# Patient Record
Sex: Male | Born: 1980 | Race: White | Hispanic: No | Marital: Single | Smoking: Current every day smoker
Health system: Southern US, Community
[De-identification: ages and names within clinical notes are randomized; demographics above are authoritative.]

---

## 2006-07-04 ENCOUNTER — Emergency Department: Payer: Self-pay | Admitting: Unknown Physician Specialty

## 2007-05-11 ENCOUNTER — Emergency Department: Payer: Self-pay

## 2008-04-27 ENCOUNTER — Emergency Department: Payer: Self-pay | Admitting: Emergency Medicine

## 2008-07-09 ENCOUNTER — Emergency Department: Payer: Self-pay | Admitting: Emergency Medicine

## 2009-02-18 ENCOUNTER — Emergency Department: Payer: Self-pay | Admitting: Internal Medicine

## 2009-07-15 ENCOUNTER — Emergency Department: Payer: Self-pay | Admitting: Emergency Medicine

## 2011-08-07 ENCOUNTER — Emergency Department: Payer: Self-pay | Admitting: Emergency Medicine

## 2016-05-02 ENCOUNTER — Emergency Department
Admission: EM | Admit: 2016-05-02 | Discharge: 2016-05-02 | Disposition: A | Discharge status: Other Acute Inpatient Hospital | Payer: Self-pay

## 2016-05-02 NOTE — ED Notes (Signed)
Pt ran out of Suboxone. Requesting a 2 day supply not from this area.

## 2016-11-19 DIAGNOSIS — S8991XA Unspecified injury of right lower leg, initial encounter: Secondary | ICD-10-CM | POA: Insufficient documentation

## 2016-11-19 DIAGNOSIS — Z5321 Procedure and treatment not carried out due to patient leaving prior to being seen by health care provider: Secondary | ICD-10-CM | POA: Insufficient documentation

## 2016-11-19 DIAGNOSIS — Y929 Unspecified place or not applicable: Secondary | ICD-10-CM | POA: Insufficient documentation

## 2016-11-19 DIAGNOSIS — F17200 Nicotine dependence, unspecified, uncomplicated: Secondary | ICD-10-CM | POA: Insufficient documentation

## 2016-11-19 DIAGNOSIS — X501XXA Overexertion from prolonged static or awkward postures, initial encounter: Secondary | ICD-10-CM | POA: Insufficient documentation

## 2016-11-19 DIAGNOSIS — Y9367 Activity, basketball: Secondary | ICD-10-CM | POA: Insufficient documentation

## 2016-11-19 DIAGNOSIS — Y999 Unspecified external cause status: Secondary | ICD-10-CM | POA: Insufficient documentation

## 2016-11-20 ENCOUNTER — Emergency Department
Admission: EM | Admit: 2016-11-20 | Discharge: 2016-11-20 | Disposition: A | Discharge status: Home / Self Care | Payer: Self-pay | Attending: Emergency Medicine | Admitting: Emergency Medicine

## 2016-11-20 ENCOUNTER — Emergency Department: Payer: Self-pay

## 2016-11-20 ENCOUNTER — Encounter: Payer: Self-pay | Admitting: Emergency Medicine

## 2016-11-20 DIAGNOSIS — X501XXA Overexertion from prolonged static or awkward postures, initial encounter: Secondary | ICD-10-CM | POA: Insufficient documentation

## 2016-11-20 DIAGNOSIS — Y9367 Activity, basketball: Secondary | ICD-10-CM | POA: Insufficient documentation

## 2016-11-20 DIAGNOSIS — Y929 Unspecified place or not applicable: Secondary | ICD-10-CM | POA: Insufficient documentation

## 2016-11-20 DIAGNOSIS — M25461 Effusion, right knee: Secondary | ICD-10-CM | POA: Insufficient documentation

## 2016-11-20 DIAGNOSIS — F17200 Nicotine dependence, unspecified, uncomplicated: Secondary | ICD-10-CM | POA: Insufficient documentation

## 2016-11-20 DIAGNOSIS — Y999 Unspecified external cause status: Secondary | ICD-10-CM | POA: Insufficient documentation

## 2016-11-20 MED ORDER — TRAMADOL HCL 50 MG PO TABS: 50.0000 mg | ORAL_TABLET | Freq: Four times a day (QID) | ORAL | 0 refills | Status: AC | PRN

## 2016-11-20 MED ORDER — IBUPROFEN 600 MG PO TABS: 600.0000 mg | ORAL_TABLET | Freq: Four times a day (QID) | ORAL | 0 refills | Status: AC | PRN

## 2016-11-20 MED ORDER — TRAMADOL HCL 50 MG PO TABS
50.0000 mg | ORAL_TABLET | Freq: Once | ORAL | Status: AC
  Administered 2016-11-20: 50 mg via ORAL
  Filled 2016-11-20: qty 1

## 2016-11-20 MED ORDER — IBUPROFEN 600 MG PO TABS
600.0000 mg | ORAL_TABLET | Freq: Once | ORAL | Status: AC
  Administered 2016-11-20: 600 mg via ORAL
  Filled 2016-11-20: qty 1

## 2016-11-20 NOTE — Discharge Instructions (Signed)
Knee immobilizer 5-7 days as needed. Do not use splint was sleeping.

## 2016-11-20 NOTE — ED Triage Notes (Signed)
Pt ambulatory to triage in NAD, reports was playing basketball, landed wrong on right leg, saw right knee deform but things snapped back in place.  Pt c/o continued pain.  Pt states currently taking suboxone and has used cocaine tonight.

## 2016-11-20 NOTE — ED Triage Notes (Signed)
Right knee injury yesterday.  Patient presented to ED last night and had X-ray's done, but LWBS.  Returns today.

## 2016-11-20 NOTE — ED Notes (Signed)
Patient already had crutches so he refused the order for crutches.

## 2016-11-20 NOTE — ED Provider Notes (Signed)
[facility] Emergency Department Provider Note   ____________________________________________   None    (approximate)  I have reviewed the triage vital signs and the nursing notes.   HISTORY  Chief Complaint Knee Injury    HPI Clayton Lopez. is a 36 y.o. male patient complaining of right knee pain and edema secondary to a twisting incident playing basketball yesterday. Patient can't ED last night had x-ray performed for left without being seen. Patient returned today with increased pain and edema. Patient stated pain increases with weightbearing.No palliative measures for complaint. Patient rates pain as a 7/10. Describes pain as "achy".  History reviewed. No pertinent past medical history.  There are no active problems to display for this patient.   History reviewed. No pertinent surgical history.  Prior to Admission medications   Medication Sig Start Date End Date Taking? Authorizing Provider  ibuprofen (ADVIL,MOTRIN) 600 MG tablet Take 1 tablet (600 mg total) by mouth every 6 (six) hours as needed. 11/20/16   Joni Reining, PA-C  traMADol (ULTRAM) 50 MG tablet Take 1 tablet (50 mg total) by mouth every 6 (six) hours as needed. 11/20/16 11/20/17  Joni Reining, PA-C    Allergies Patient has no known allergies.  No family history on file.  Social History Social History  Substance Use Topics  . Smoking status: Current Every Day Smoker  . Smokeless tobacco: Never Used  . Alcohol use Yes    Review of Systems  Constitutional: No fever/chills Eyes: No visual changes. ENT: No sore throat. Cardiovascular: Denies chest pain. Respiratory: Denies shortness of breath. Gastrointestinal: No abdominal pain.  No nausea, no vomiting.  No diarrhea.  No constipation. Genitourinary: Negative for dysuria. Musculoskeletal: Negative for back pain. Skin: Negative for rash.  ____________________________________________   PHYSICAL EXAM:  VITAL  SIGNS: ED Triage Vitals [11/20/16 1113]  Enc Vitals Group     BP      Pulse      Resp      Temp      Temp src      SpO2      Weight 168 lb (76.2 kg)     Height 5\' 9"  (1.753 m)     Head Circumference      Peak Flow      Pain Score 7     Pain Loc      Pain Edu?      Excl. in GC?     Constitutional: Alert and oriented. Well appearing and in no acute distress. Cardiovascular: Normal rate, regular rhythm. Grossly normal heart sounds.  Good peripheral circulation. Respiratory: Normal respiratory effort.  No retractions. Lungs CTAB. Gastrointestinal: Soft and nontender. No distention. No abdominal bruits. No CVA tenderness. Musculoskeletal: No obvious deformity to the right knee. Mild edema to the lateral aspect of the patella. Moderate crepitus palpation anterior patella. No laxity with stress testing. Positive joint effusions. Neurologic:  Normal speech and language. No gross focal neurologic deficits are appreciated. No gait instability. Skin:  Skin is warm, dry and intact. No rash noted. Psychiatric: Mood and affect are normal. Speech and behavior are normal.  ____________________________________________   LABS (all labs ordered are listed, but only abnormal results are displayed)  Labs Reviewed - No data to display ____________________________________________  EKG   ____________________________________________  RADIOLOGY  No obvious fracture on x-ray of the right knee. Moderate joint effusion. ____________________________________________   PROCEDURES  Procedure(s) performed: None  Procedures  Critical Care performed: No  ____________________________________________  INITIAL IMPRESSION / ASSESSMENT AND PLAN / ED COURSE  Pertinent labs & imaging results that were available during my care of the patient were reviewed by me and considered in my medical decision making (see chart for details).  Right knee sprain left joint effusion. Discussed x-ray finding  with patient. Patient placed in the immobilizer. Patient advised to ambulate with crutches as needed. Patient given discharge care instructions and advised to follow orthopedics no improvement in 5-7 days.      ____________________________________________   FINAL CLINICAL IMPRESSION(S) / ED DIAGNOSES  Final diagnoses:  Effusion of right knee      NEW MEDICATIONS STARTED DURING THIS VISIT:  New Prescriptions   IBUPROFEN (ADVIL,MOTRIN) 600 MG TABLET    Take 1 tablet (600 mg total) by mouth every 6 (six) hours as needed.   TRAMADOL (ULTRAM) 50 MG TABLET    Take 1 tablet (50 mg total) by mouth every 6 (six) hours as needed.     Note:  This document was prepared using Dragon voice recognition software and may include unintentional dictation errors.    Joni ReiningSmith, Vielka Klinedinst K, PA-C 11/20/16 1157    Governor RooksLord, Rebecca, MD 11/20/16 1300

## 2017-08-04 IMAGING — CR DG KNEE COMPLETE 4+V*R*
4 series · 4 of 4 positions shown · non-contrast
Comparison: None.

CLINICAL DATA: Right knee injury while playing basketball

EXAM:
RIGHT KNEE - COMPLETE 4+ VIEW

[knee ap]
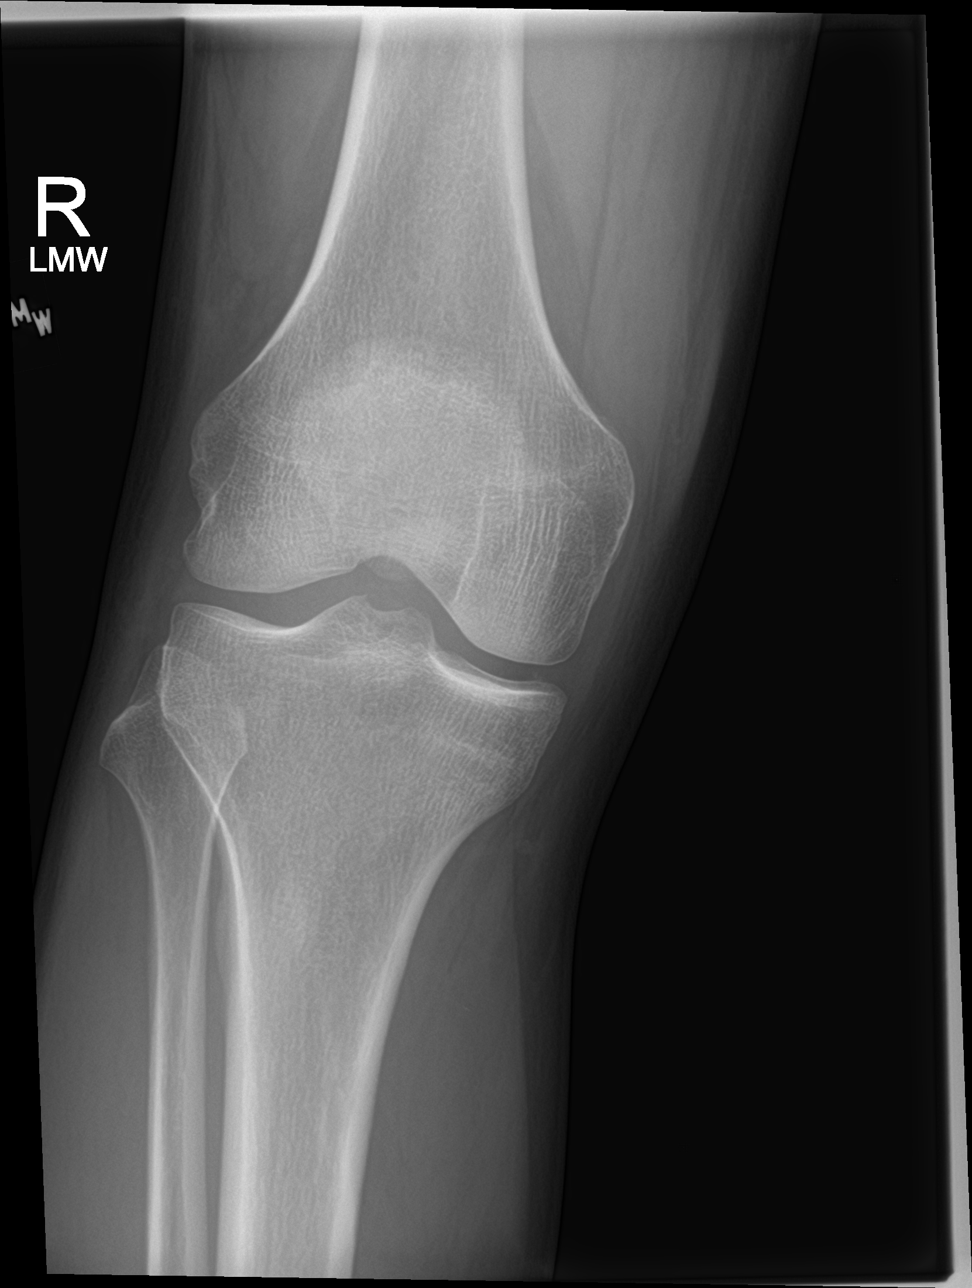

[knee obl (1 of 2)]
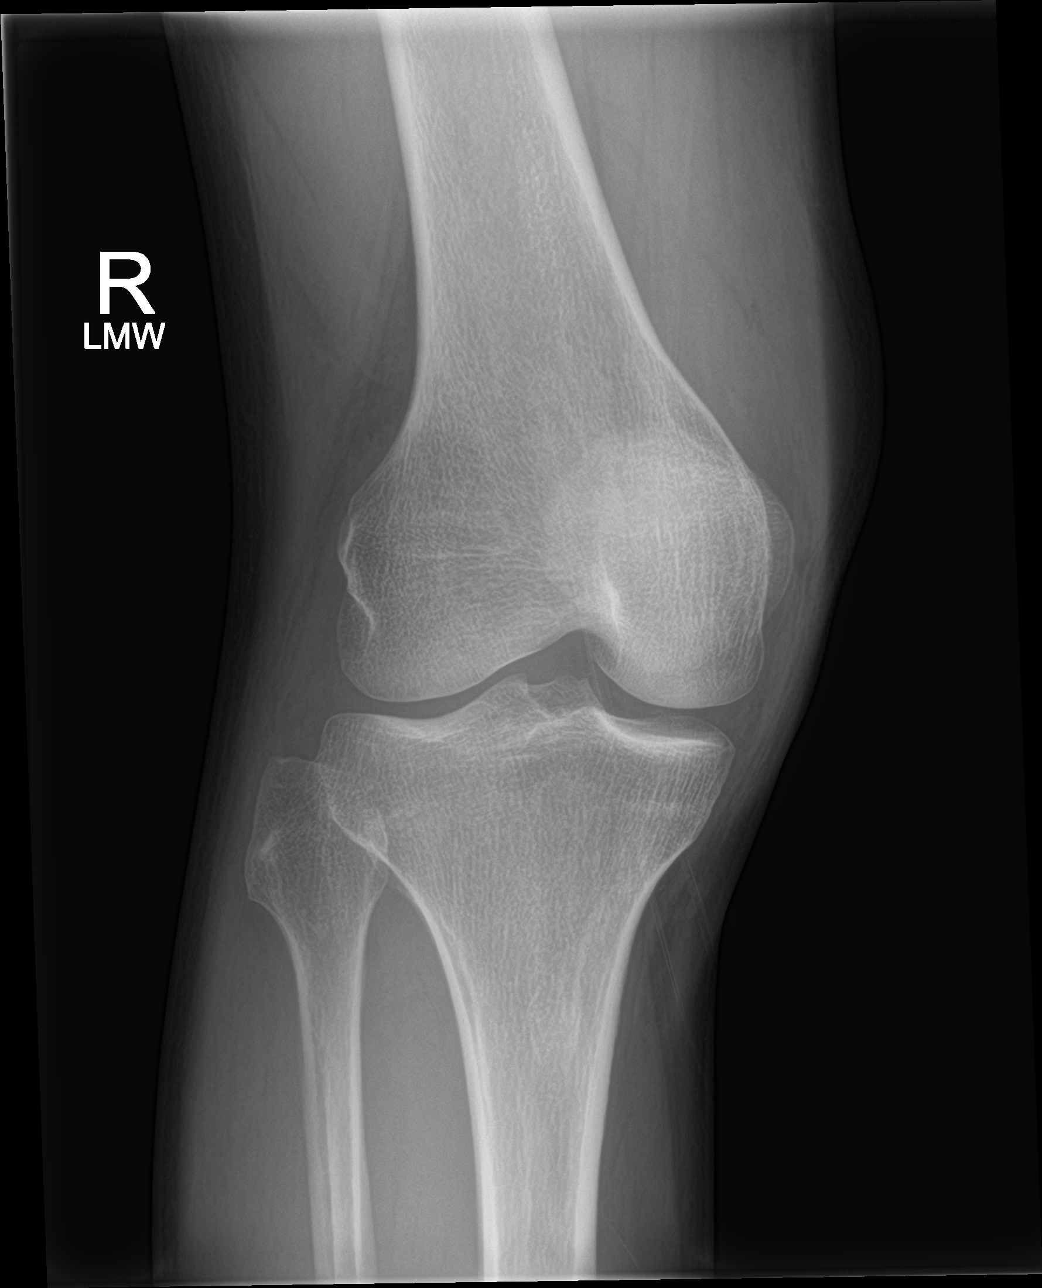

[knee obl (2 of 2)]
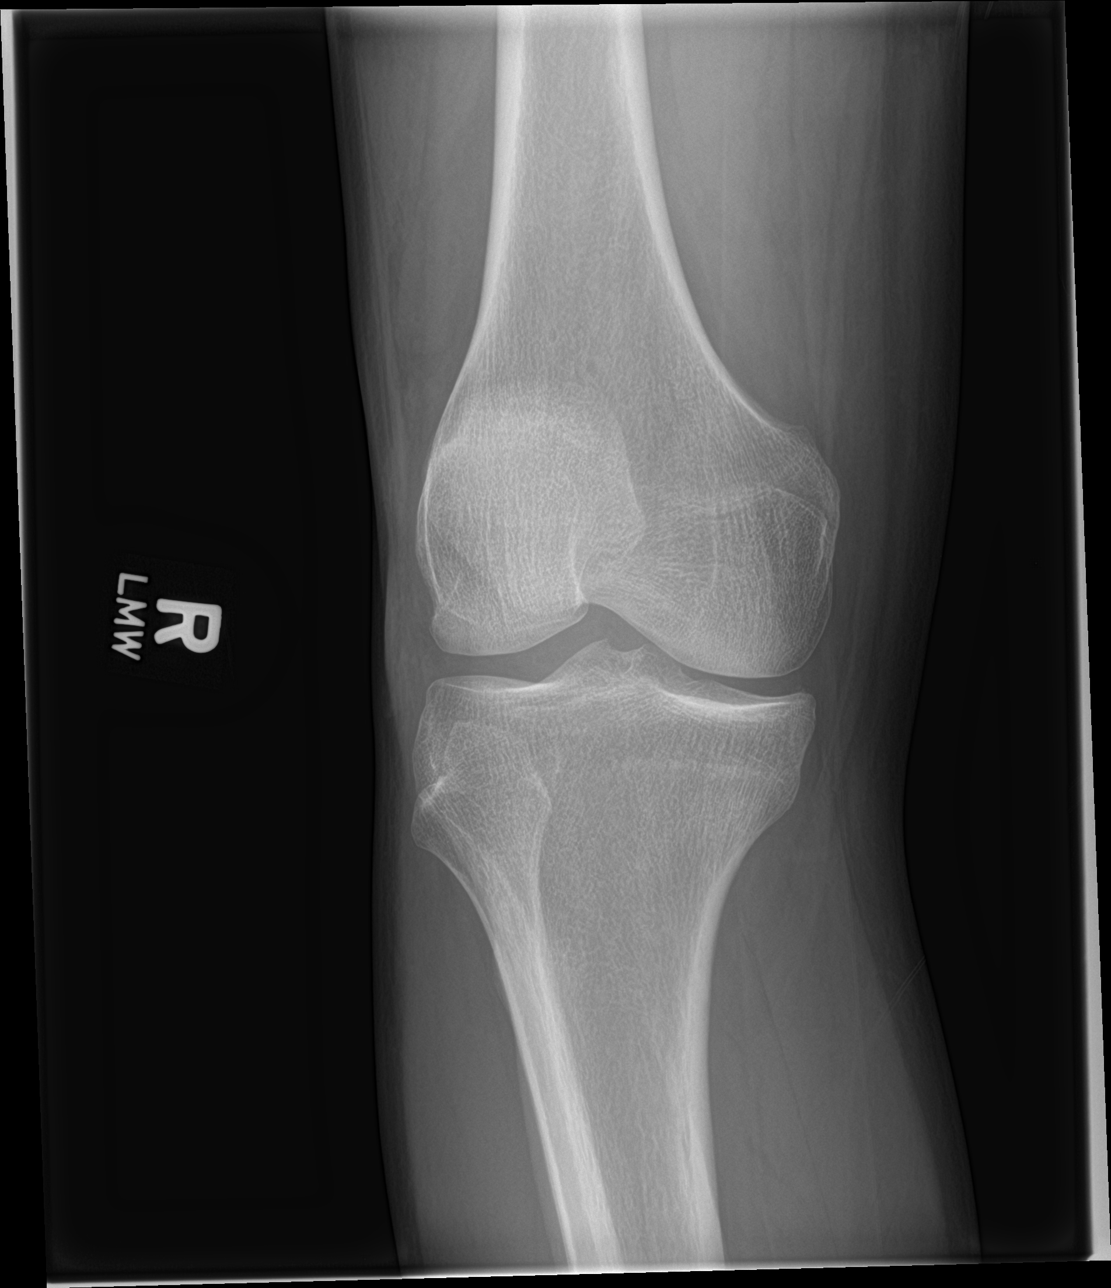

[knee lat]
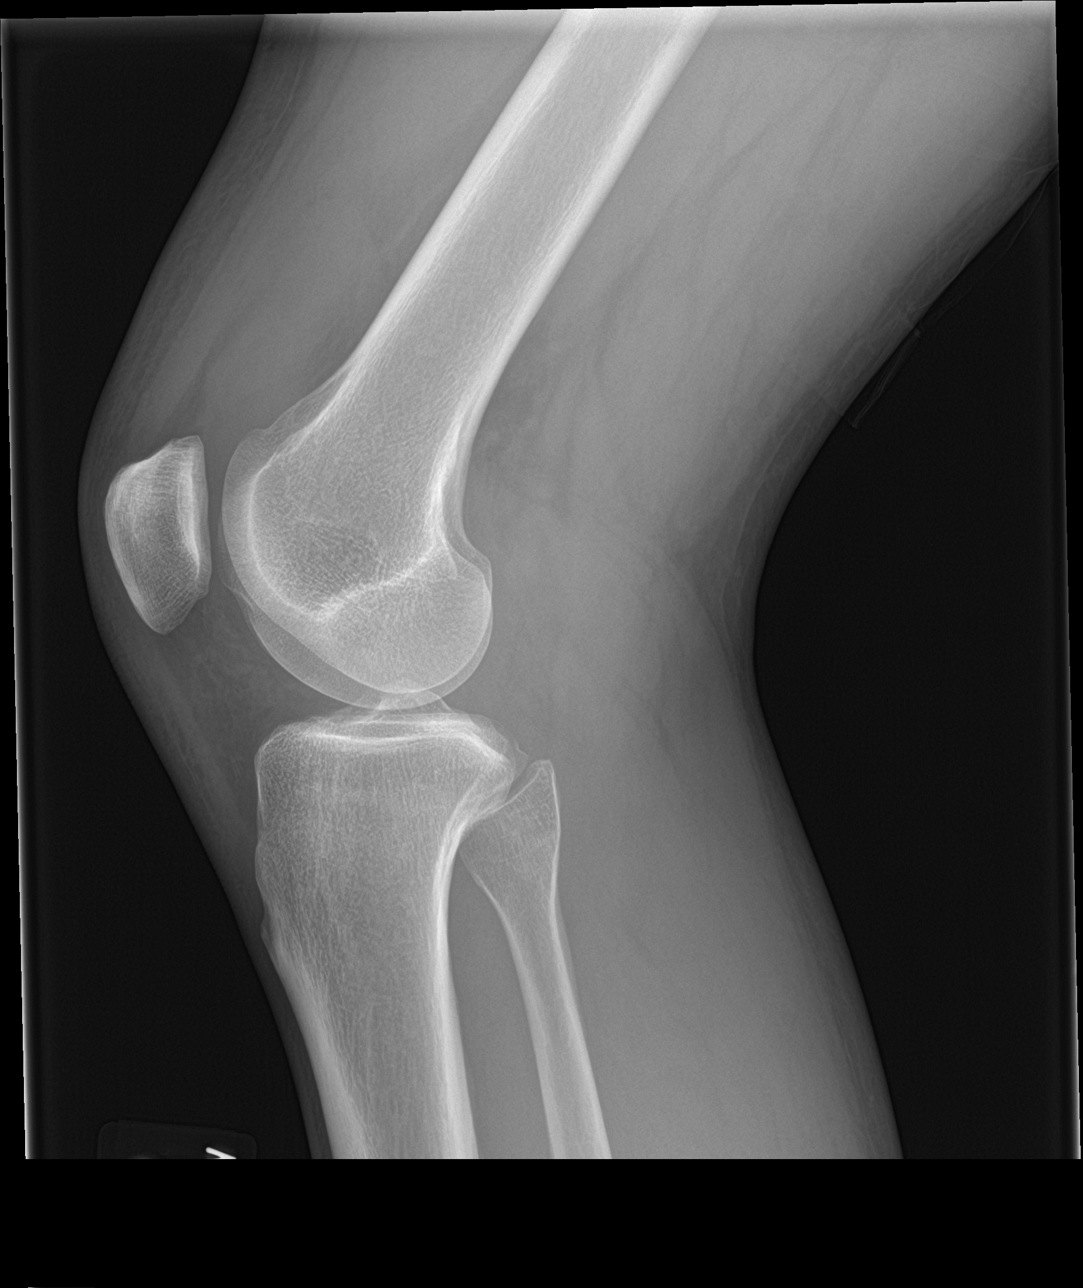

[4 of 4 positions shown; findings below may reference images not displayed]

FINDINGS: There is no fracture or dislocation of the right knee. There is a
medium-sized suprapatellar effusion.
IMPRESSION: Medium-sized suprapatellar effusion without acute fracture or
dislocation.
